# Patient Record
Sex: Male | Born: 1995 | Hispanic: Refuse to answer | Marital: Single | Smoking: Never smoker
Health system: Southern US, Community
[De-identification: ages and names within clinical notes are randomized; demographics above are authoritative.]

---

## 2018-01-01 ENCOUNTER — Encounter: Payer: Self-pay | Admitting: Family Medicine

## 2018-01-01 ENCOUNTER — Ambulatory Visit (INDEPENDENT_AMBULATORY_CARE_PROVIDER_SITE_OTHER): Payer: PRIVATE HEALTH INSURANCE | Admitting: Family Medicine

## 2018-01-01 VITALS — BP 125/67 | HR 65 | Temp 97.3°F | Resp 14

## 2018-01-01 DIAGNOSIS — R05 Cough: Secondary | ICD-10-CM

## 2018-01-01 DIAGNOSIS — R059 Cough, unspecified: Secondary | ICD-10-CM

## 2018-01-01 DIAGNOSIS — R0981 Nasal congestion: Secondary | ICD-10-CM | POA: Diagnosis not present

## 2018-01-01 MED ORDER — AZITHROMYCIN 250 MG PO TABS
ORAL_TABLET | ORAL | 0 refills | Status: DC
Start: 2018-01-01 — End: 2018-09-11

## 2018-01-01 MED ORDER — ALBUTEROL SULFATE HFA 108 (90 BASE) MCG/ACT IN AERS: 2.0000 | INHALATION_SPRAY | Freq: Four times a day (QID) | RESPIRATORY_TRACT | 0 refills | Status: DC | PRN

## 2018-01-02 LAB — CBC WITH DIFFERENTIAL/PLATELET
BASOS: 0 %
Basophils Absolute: 0 10*3/uL (ref 0.0–0.2)
EOS (ABSOLUTE): 0 10*3/uL (ref 0.0–0.4)
EOS: 0 %
HEMATOCRIT: 45.9 % (ref 37.5–51.0)
Hemoglobin: 16 g/dL (ref 13.0–17.7)
IMMATURE GRANS (ABS): 0 10*3/uL (ref 0.0–0.1)
IMMATURE GRANULOCYTES: 0 %
LYMPHS: 13 %
Lymphocytes Absolute: 1.9 10*3/uL (ref 0.7–3.1)
MCH: 30.9 pg (ref 26.6–33.0)
MCHC: 34.9 g/dL (ref 31.5–35.7)
MCV: 89 fL (ref 79–97)
MONOS ABS: 1.4 10*3/uL — AB (ref 0.1–0.9)
Monocytes: 9 %
NEUTROS ABS: 11.4 10*3/uL — AB (ref 1.4–7.0)
NEUTROS PCT: 78 %
Platelets: 253 10*3/uL (ref 150–379)
RBC: 5.18 x10E6/uL (ref 4.14–5.80)
RDW: 12.3 % (ref 12.3–15.4)
WBC: 14.9 10*3/uL — ABNORMAL HIGH (ref 3.4–10.8)

## 2018-01-22 ENCOUNTER — Ambulatory Visit (INDEPENDENT_AMBULATORY_CARE_PROVIDER_SITE_OTHER): Payer: PRIVATE HEALTH INSURANCE | Admitting: Family Medicine

## 2018-01-22 ENCOUNTER — Encounter: Payer: Self-pay | Admitting: Family Medicine

## 2018-01-22 VITALS — BP 122/68 | HR 61 | Temp 96.9°F | Resp 14

## 2018-01-22 DIAGNOSIS — J0110 Acute frontal sinusitis, unspecified: Secondary | ICD-10-CM

## 2018-01-22 MED ORDER — AMOXICILLIN-POT CLAVULANATE 875-125 MG PO TABS: 1.0000 | ORAL_TABLET | Freq: Two times a day (BID) | ORAL | 0 refills | Status: DC

## 2018-01-22 NOTE — Progress Notes (Signed)
Patient presents today with symptoms of nasal congestion, sinus pressure, minimal cough. Patient was seen a few weeks ago and was treated with a Z-Pak for a productive cough. His WBC count was 14. He states that his cough has improved but now he has sinus pressure and nasal congestion. He denies any fever or chills. He denies any weight loss. He denies any chest pain or shortness of breath. He is leaving on a trip to [place] and [place] in 2 [place].  ROS: Negative except mentioned above. Vitals as per Epic.  GENERAL: NAD HEENT: mild pharyngeal erythema, no exudate, no erythema of TMs, no cervical LAD, mild maxillary and frontal sinus tenderness RESP: CTA B CARD: RRR NEURO: CN II-XII grossly intact   A/P: Sinusitis -will treat with Augmentin, Medrol Dose Pk, Tylenol when necessary, Claritin-D when necessary, rest, hydration, seek medical attention if symptoms persist or worsen. Encouraged patient to take probiotic since he is on a second course of antibiotics. Also discussed risks of steroid use. Patient addresses understanding of above.

## 2018-01-23 NOTE — Progress Notes (Signed)
Patient presents today with symptoms of cough. Patient states that he has had a cough for the last 2-3 weeks. He describes it is slightly productive. He denies any fever, chills, weight loss, sore throat, nasal congestion. He denies any possible new allergens. He denies any history of asthma. He states that the mucus is yellow at times.  ROS: Negative except mentioned above. Vitals as per Epic. GENERAL: NAD HEENT: no pharyngeal erythema, no exudate, no erythema of TMs, no cervical LAD RESP: slightly decreased breath sounds at the bases, no accessory muscle use CARD: RRR NEURO: CN II-XII grossly intact   A/P: URI - will treat with Z-Pak, Delsym when necessary, Albuterol Inhaler when necessary, Delsym when necessary, oral antihistamine when necessary, will do CBC with differential given length of symptoms, if symptoms of cough persist will do chest x-ray, no athletic activity or class if febrile, seek medical attention if symptoms persist or worsen as discussed.

## 2018-01-24 ENCOUNTER — Other Ambulatory Visit: Payer: Self-pay | Admitting: Family Medicine

## 2018-01-24 DIAGNOSIS — R05 Cough: Secondary | ICD-10-CM

## 2018-01-24 DIAGNOSIS — R059 Cough, unspecified: Secondary | ICD-10-CM

## 2018-09-11 ENCOUNTER — Encounter: Payer: Self-pay | Admitting: Family Medicine

## 2018-09-11 ENCOUNTER — Ambulatory Visit (INDEPENDENT_AMBULATORY_CARE_PROVIDER_SITE_OTHER): Payer: Self-pay | Admitting: Family Medicine

## 2018-09-11 VITALS — BP 136/79 | HR 59 | Temp 97.9°F | Resp 14

## 2018-09-11 DIAGNOSIS — R0982 Postnasal drip: Secondary | ICD-10-CM

## 2018-09-11 DIAGNOSIS — J029 Acute pharyngitis, unspecified: Secondary | ICD-10-CM

## 2018-09-11 DIAGNOSIS — R51 Headache: Secondary | ICD-10-CM

## 2018-09-11 LAB — POCT RAPID STREP A (OFFICE): RAPID STREP A SCREEN: NEGATIVE

## 2018-09-11 MED ORDER — AMOXICILLIN 500 MG PO CAPS: 500.0000 mg | ORAL_CAPSULE | Freq: Two times a day (BID) | ORAL | 0 refills | Status: AC

## 2018-09-11 NOTE — Progress Notes (Signed)
Patient presents today with symptoms of sore throat.  Patient states that he has had symptoms for the last few days.  He does have mild headache and some nasal drainage.  He denies any significant cough.  He denies any abdominal pain, nausea, vomiting, diarrhea, night sweats, extreme fatigue.  He did take acetaminophen earlier today so is unsure whether he had a fever.  He denies any sick contacts.  ROS: Negative except mentioned above. Vitals as per Epic. GENERAL: NAD HEENT: moderate pharyngeal erythema, no exudate, no erythema of TMs, shotty cervical LAD RESP: CTA B CARD: RRR ABD: Positive bowel sounds, nontender, no rebound or guarding appreciated, no organomegaly appreciated NEURO: CN II-XII grossly intact   A/P: Pharyngitis -rapid strep test was negative, given patient's symptoms will prescribe Amoxicillin, Tylenol/Ibuprofen as needed, rest, hydration, seek medical attention if symptoms persist or worsen as discussed.  Can return back to athletic activity and class if afebrile for 24 hours without taking fever lowering medication.

## 2019-06-30 ENCOUNTER — Other Ambulatory Visit: Payer: Self-pay | Admitting: Family Medicine

## 2019-06-30 DIAGNOSIS — Z1159 Encounter for screening for other viral diseases: Secondary | ICD-10-CM

## 2019-07-02 ENCOUNTER — Encounter: Payer: Self-pay | Admitting: Family Medicine

## 2019-07-02 LAB — NOVEL CORONAVIRUS, NAA: SARS-CoV-2, NAA: NOT DETECTED

## 2019-07-13 ENCOUNTER — Other Ambulatory Visit: Payer: Self-pay

## 2019-07-13 ENCOUNTER — Other Ambulatory Visit: Payer: Self-pay | Admitting: Family Medicine

## 2019-07-13 ENCOUNTER — Ambulatory Visit
Admission: RE | Admit: 2019-07-13 | Discharge: 2019-07-13 | Disposition: A | Discharge status: Home / Self Care | Payer: Managed Care, Other (non HMO) | Source: Ambulatory Visit | Attending: Family Medicine | Admitting: Family Medicine

## 2019-07-13 DIAGNOSIS — S6991XA Unspecified injury of right wrist, hand and finger(s), initial encounter: Secondary | ICD-10-CM | POA: Insufficient documentation

## 2019-07-20 ENCOUNTER — Other Ambulatory Visit: Payer: Self-pay

## 2019-07-20 ENCOUNTER — Other Ambulatory Visit: Payer: Self-pay | Admitting: *Deleted

## 2019-07-20 ENCOUNTER — Telehealth (INDEPENDENT_AMBULATORY_CARE_PROVIDER_SITE_OTHER): Payer: Managed Care, Other (non HMO) | Admitting: Family Medicine

## 2019-07-20 DIAGNOSIS — R6883 Chills (without fever): Secondary | ICD-10-CM

## 2019-07-20 DIAGNOSIS — R0981 Nasal congestion: Secondary | ICD-10-CM

## 2019-07-20 DIAGNOSIS — R51 Headache: Secondary | ICD-10-CM | POA: Diagnosis not present

## 2019-07-20 DIAGNOSIS — Z20828 Contact with and (suspected) exposure to other viral communicable diseases: Secondary | ICD-10-CM

## 2019-07-20 DIAGNOSIS — J029 Acute pharyngitis, unspecified: Secondary | ICD-10-CM | POA: Diagnosis not present

## 2019-07-20 DIAGNOSIS — Z20822 Contact with and (suspected) exposure to covid-19: Secondary | ICD-10-CM

## 2019-07-20 NOTE — Progress Notes (Signed)
Virtual Visit Agreed To  Patient states started yesterday with headache, sore throat, chills, nasal congestion. Denies fever, CP, SOB, N/V/D, abdominal pain, loss of taste or smell. Hasn't taken any medications. Has own room but shares bathroom. .   ROS: Negative except mentioned above. GENERAL: NAD NEURO: CN II-XII grossly intact  No Exam.   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

## 2019-07-21 ENCOUNTER — Other Ambulatory Visit: Payer: Self-pay

## 2019-07-22 LAB — NOVEL CORONAVIRUS, NAA: SARS-CoV-2, NAA: DETECTED — AB

## 2019-08-01 ENCOUNTER — Other Ambulatory Visit: Payer: Self-pay | Admitting: Family Medicine

## 2019-08-01 DIAGNOSIS — Z8616 Personal history of COVID-19: Secondary | ICD-10-CM

## 2019-08-01 DIAGNOSIS — Z8619 Personal history of other infectious and parasitic diseases: Secondary | ICD-10-CM

## 2019-08-01 DIAGNOSIS — I400 Infective myocarditis: Secondary | ICD-10-CM

## 2019-08-03 ENCOUNTER — Other Ambulatory Visit: Payer: Managed Care, Other (non HMO) | Admitting: Family Medicine

## 2019-08-03 DIAGNOSIS — Z0000 Encounter for general adult medical examination without abnormal findings: Secondary | ICD-10-CM

## 2019-08-04 LAB — TROPONIN I: Troponin I: <0.01 ng/mL (ref 0.00–0.04)

## 2019-08-16 ENCOUNTER — Other Ambulatory Visit: Payer: Managed Care, Other (non HMO)

## 2019-08-17 ENCOUNTER — Ambulatory Visit (INDEPENDENT_AMBULATORY_CARE_PROVIDER_SITE_OTHER): Payer: Managed Care, Other (non HMO)

## 2019-08-17 ENCOUNTER — Other Ambulatory Visit: Payer: Self-pay

## 2019-08-17 DIAGNOSIS — U071 COVID-19: Secondary | ICD-10-CM

## 2019-08-17 DIAGNOSIS — Z8619 Personal history of other infectious and parasitic diseases: Secondary | ICD-10-CM | POA: Diagnosis not present

## 2019-08-17 DIAGNOSIS — I400 Infective myocarditis: Secondary | ICD-10-CM

## 2019-08-17 DIAGNOSIS — Z8616 Personal history of COVID-19: Secondary | ICD-10-CM

## 2020-01-24 ENCOUNTER — Ambulatory Visit: Payer: Managed Care, Other (non HMO) | Attending: Internal Medicine

## 2020-01-24 DIAGNOSIS — Z23 Encounter for immunization: Secondary | ICD-10-CM

## 2020-01-24 NOTE — Progress Notes (Signed)
   Covid-19 Vaccination Clinic  Name:  Mario Reilly    MRN: [id number] DOB: 12/11/1995  01/24/2020  [name] was observed post Covid-19 immunization for 15 minutes without incident. He was provided with Vaccine Information Sheet and instruction to access the V-Safe system.   [name] was instructed to call 911 with any severe reactions post vaccine: Marland Kitchen Difficulty breathing  . Swelling of face and throat  . A fast heartbeat  . A bad rash all over body  . Dizziness and weakness   Immunizations Administered    Name Date Dose VIS Date Route   Pfizer COVID-19 Vaccine 01/24/2020  3:00 PM 0.3 mL 10/15/2019 Intramuscular   Manufacturer: [employer], [employer]   Lot: [id number]   NDC: [address]-2

## 2020-02-14 ENCOUNTER — Ambulatory Visit: Payer: Managed Care, Other (non HMO) | Attending: Internal Medicine

## 2020-02-14 DIAGNOSIS — Z23 Encounter for immunization: Secondary | ICD-10-CM

## 2020-02-14 NOTE — Progress Notes (Signed)
   Covid-19 Vaccination Clinic  Name:  Mario Reilly    MRN: [id number] DOB: 09-01-1996  02/14/2020  Mario Reilly was observed post Covid-19 immunization for 15 minutes without incident. He was provided with Vaccine Information Sheet and instruction to access the V-Safe system.   Mario Reilly was instructed to call 911 with any severe reactions post vaccine: Marland Kitchen Difficulty breathing  . Swelling of face and throat  . A fast heartbeat  . A bad rash all over body  . Dizziness and weakness   Immunizations Administered    Name Date Dose VIS Date Route   Pfizer COVID-19 Vaccine 02/14/2020  1:39 PM 0.3 mL 10/15/2019 Intramuscular   Manufacturer: [employer], [employer]   Lot: [id number]   NDC: [address]-7

## 2020-08-27 IMAGING — CR DG HAND COMPLETE 3+V*R*
1 series · 3 of 3 positions shown · non-contrast
Comparison: None.

CLINICAL DATA: Pain after hitting door

EXAM:
RIGHT HAND - COMPLETE 3+ VIEW

[Series 1: dg hand complete right · 0.14mm/px · 3 of 3 slices shown]
[im 1/3]
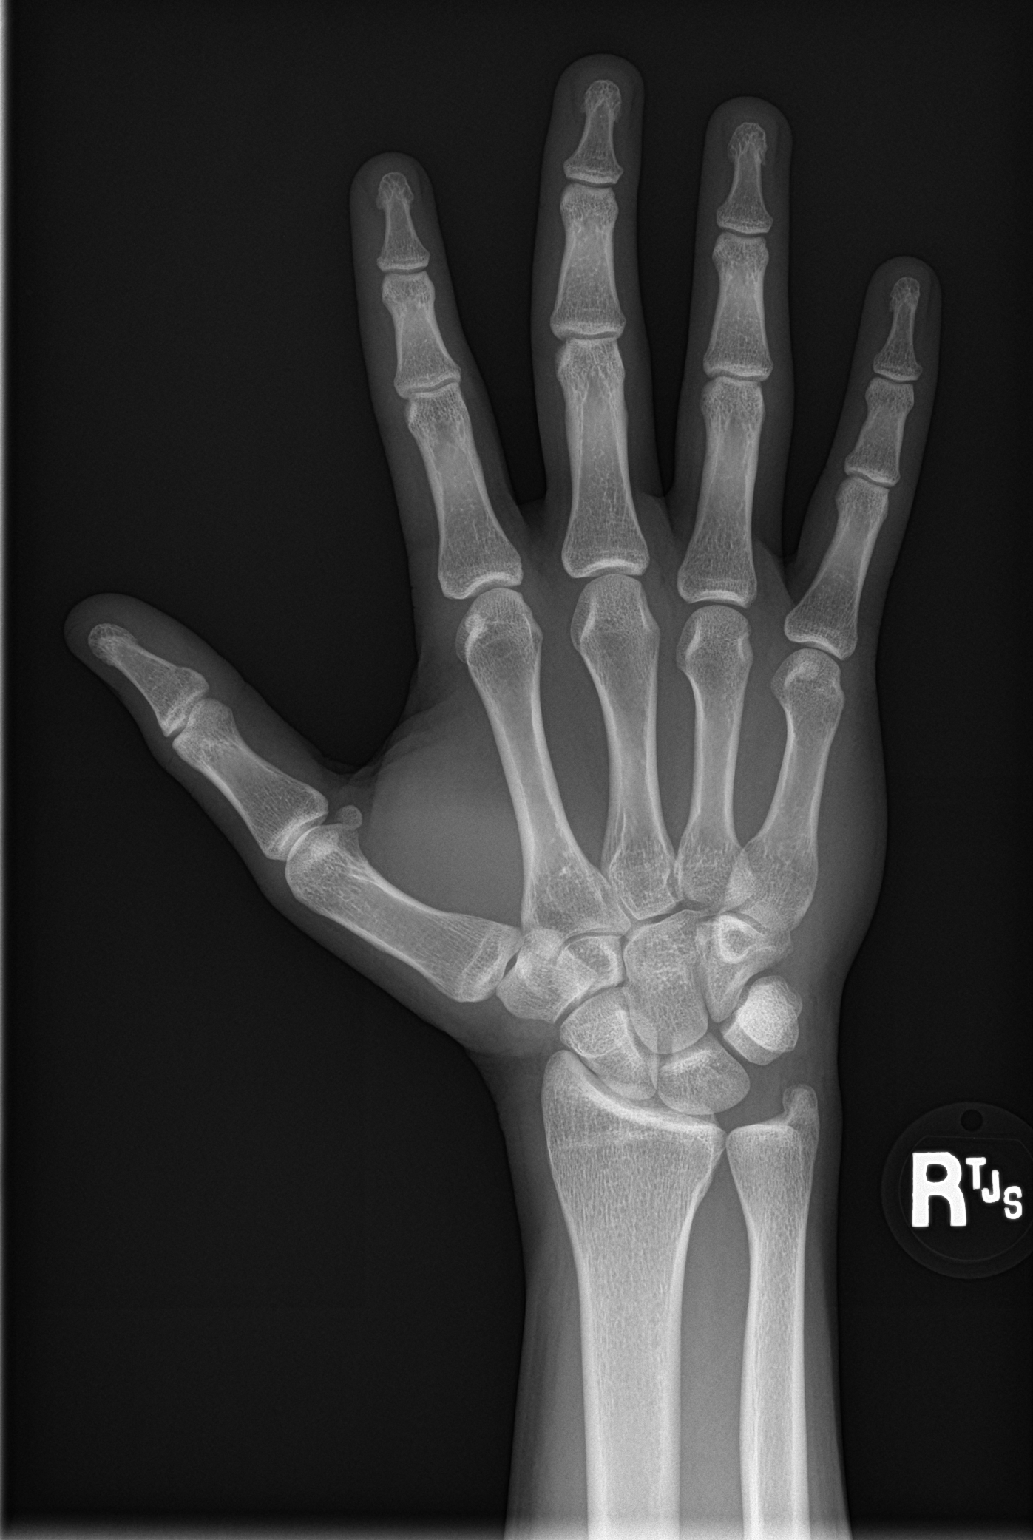
[im 2/3]
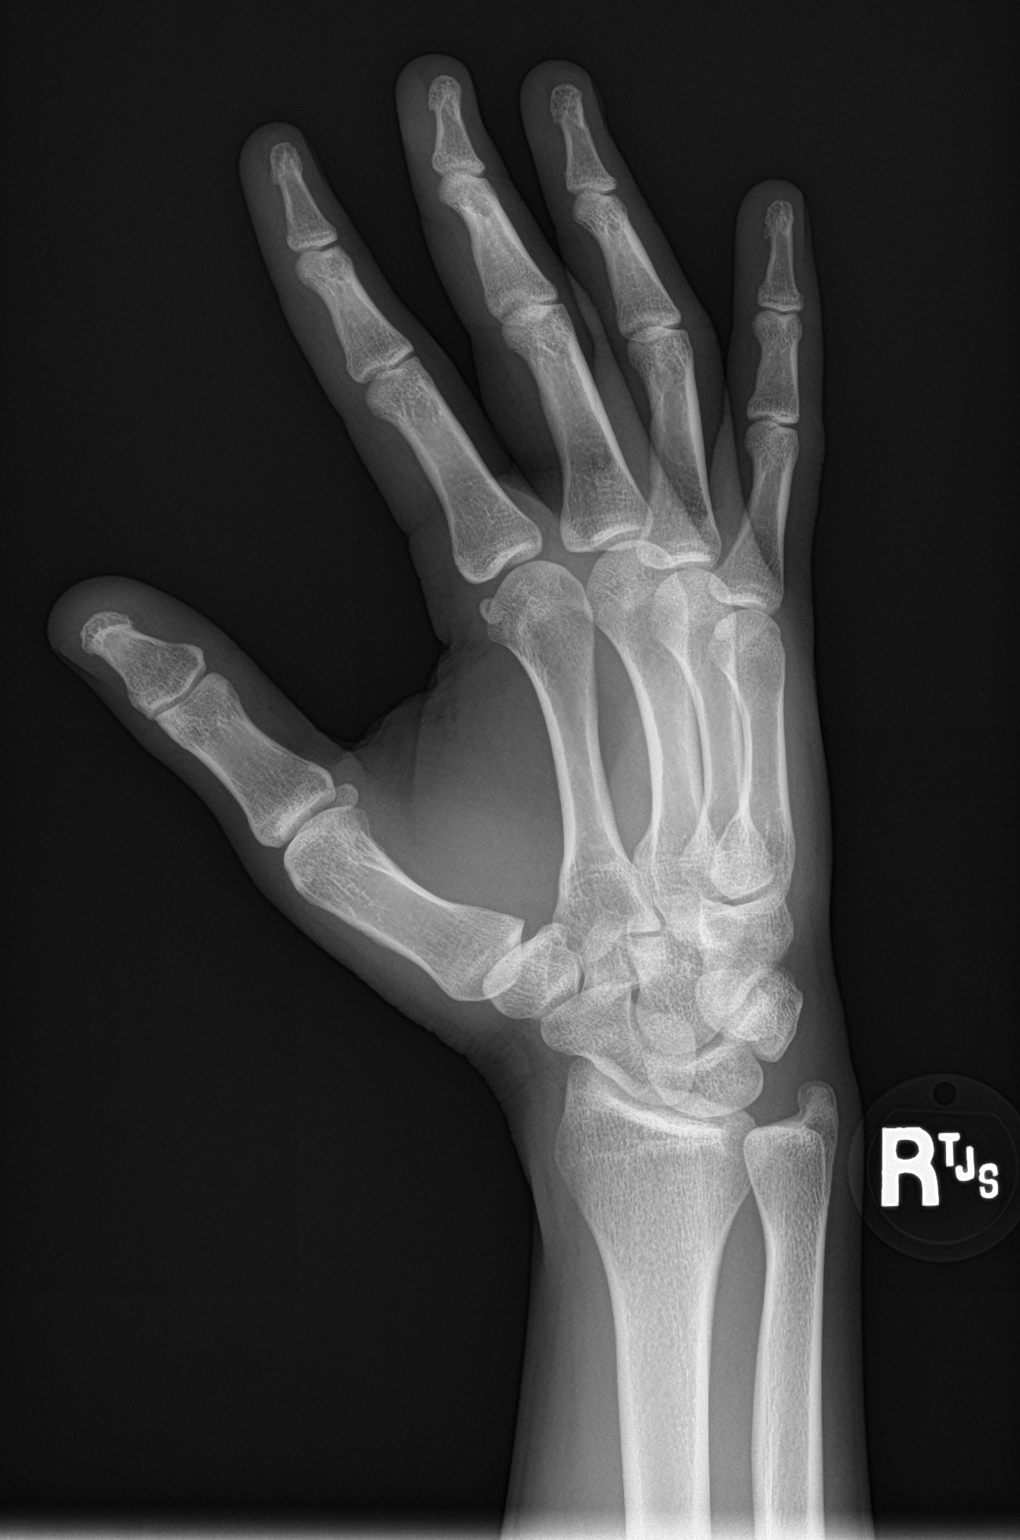
[im 3/3]
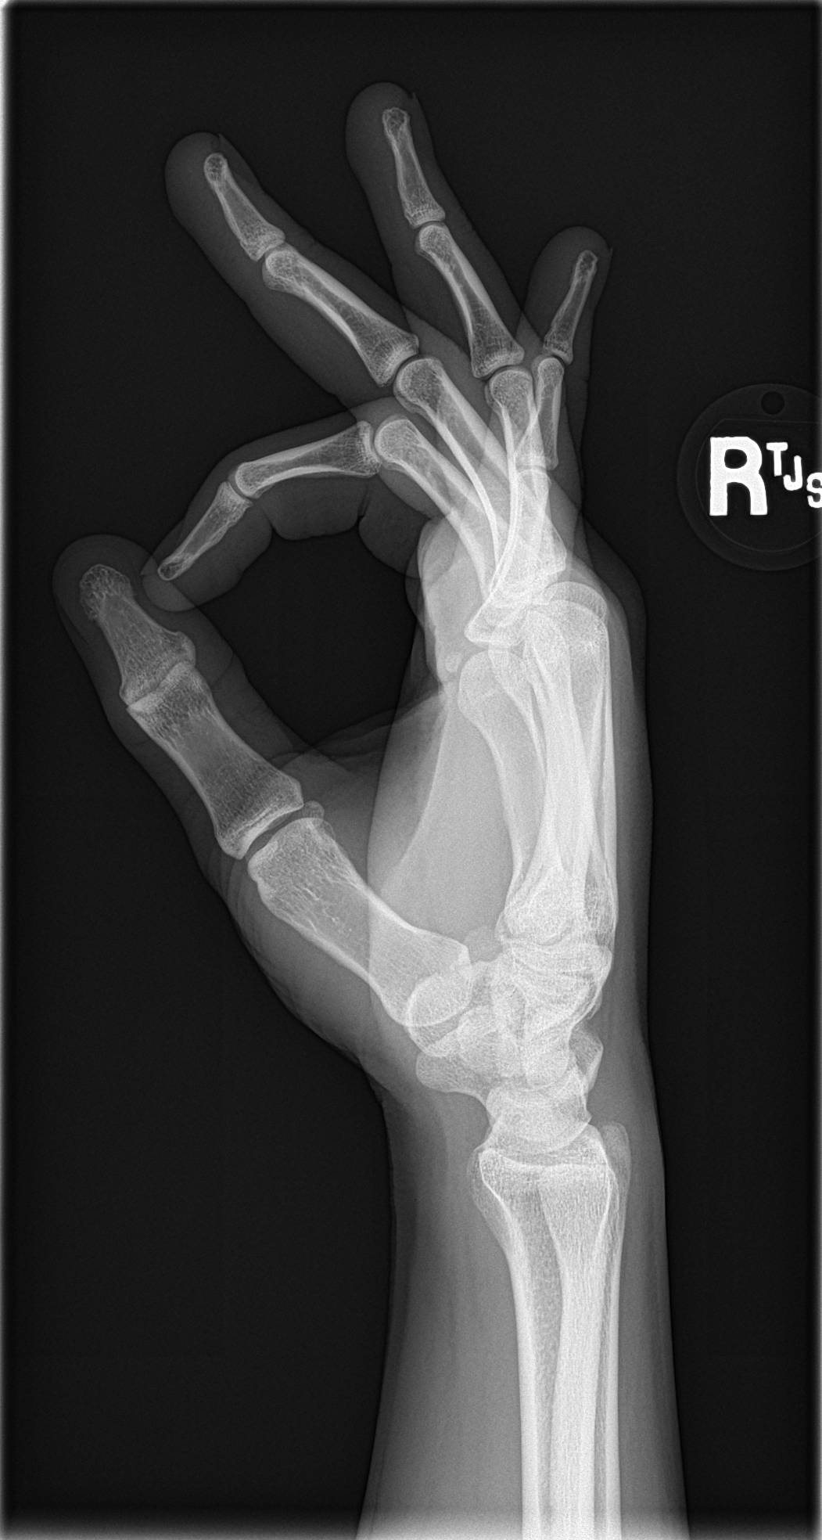

[3 of 3 positions shown; findings below may reference images not displayed]

FINDINGS: Frontal, oblique, and lateral views were obtained. There is a subtle
lucency in the distal fifth metacarpal, a likely focus of incomplete
fracture. Alignment in this area is anatomic. No other fracture
evident. No dislocation. Joint spaces appear normal. There is soft
tissue swelling medially.
IMPRESSION: Rather subtle lucency in the distal fifth metacarpal, a likely
incomplete fracture in this area. There is soft tissue swelling in
this area. Alignment is anatomic. No other evident fracture. No
dislocation. No appreciable arthropathic change.
